# Patient Record
Sex: Female | Born: 1990 | Race: Black or African American | Hispanic: No | Marital: Single | Smoking: Never smoker
Health system: Southern US, Community
[De-identification: ages and names within clinical notes are randomized; demographics above are authoritative.]

---

## 2015-08-31 ENCOUNTER — Other Ambulatory Visit (HOSPITAL_COMMUNITY): Payer: Self-pay | Admitting: Unknown Physician Specialty

## 2015-08-31 DIAGNOSIS — Z3689 Encounter for other specified antenatal screening: Secondary | ICD-10-CM

## 2015-08-31 DIAGNOSIS — Z3A30 30 weeks gestation of pregnancy: Secondary | ICD-10-CM

## 2015-08-31 DIAGNOSIS — O283 Abnormal ultrasonic finding on antenatal screening of mother: Secondary | ICD-10-CM

## 2015-09-01 ENCOUNTER — Other Ambulatory Visit (HOSPITAL_COMMUNITY): Payer: Self-pay | Admitting: Unknown Physician Specialty

## 2015-09-01 DIAGNOSIS — Z3689 Encounter for other specified antenatal screening: Secondary | ICD-10-CM

## 2015-09-01 DIAGNOSIS — Q893 Situs inversus: Secondary | ICD-10-CM

## 2015-09-01 DIAGNOSIS — Z3A30 30 weeks gestation of pregnancy: Secondary | ICD-10-CM

## 2015-09-01 DIAGNOSIS — Q249 Congenital malformation of heart, unspecified: Secondary | ICD-10-CM

## 2015-09-02 ENCOUNTER — Ambulatory Visit (HOSPITAL_COMMUNITY)
Admission: RE | Admit: 2015-09-02 | Discharge: 2015-09-02 | Disposition: A | Discharge status: Home / Self Care | Payer: Medicaid Other | Source: Ambulatory Visit | Attending: Unknown Physician Specialty | Admitting: Unknown Physician Specialty

## 2015-09-02 ENCOUNTER — Other Ambulatory Visit (HOSPITAL_COMMUNITY): Payer: Self-pay | Admitting: Unknown Physician Specialty

## 2015-09-02 ENCOUNTER — Encounter (HOSPITAL_COMMUNITY): Payer: Self-pay

## 2015-09-02 DIAGNOSIS — Z3A29 29 weeks gestation of pregnancy: Secondary | ICD-10-CM | POA: Diagnosis not present

## 2015-09-02 DIAGNOSIS — Z315 Encounter for genetic counseling: Secondary | ICD-10-CM | POA: Insufficient documentation

## 2015-09-02 DIAGNOSIS — Q249 Congenital malformation of heart, unspecified: Secondary | ICD-10-CM

## 2015-09-02 DIAGNOSIS — O35BXX0 Maternal care for other (suspected) fetal abnormality and damage, fetal cardiac anomalies, not applicable or unspecified: Secondary | ICD-10-CM | POA: Insufficient documentation

## 2015-09-02 DIAGNOSIS — Z36 Encounter for antenatal screening of mother: Secondary | ICD-10-CM | POA: Insufficient documentation

## 2015-09-02 DIAGNOSIS — Z3689 Encounter for other specified antenatal screening: Secondary | ICD-10-CM

## 2015-09-02 DIAGNOSIS — Q893 Situs inversus: Secondary | ICD-10-CM

## 2015-09-02 DIAGNOSIS — O358XX0 Maternal care for other (suspected) fetal abnormality and damage, not applicable or unspecified: Secondary | ICD-10-CM | POA: Insufficient documentation

## 2015-09-02 DIAGNOSIS — Z3A30 30 weeks gestation of pregnancy: Secondary | ICD-10-CM

## 2015-09-02 DIAGNOSIS — O283 Abnormal ultrasonic finding on antenatal screening of mother: Secondary | ICD-10-CM | POA: Insufficient documentation

## 2015-09-02 NOTE — Progress Notes (Signed)
Genetic Counseling  High-Risk Gestation Note  Appointment Date:  09/02/2015 Referred By: Sonya Crown, MD Date of Birth:  [birth year]   Pregnancy History: G1P0 Estimated Date of Delivery: 11/13/15 Estimated Gestational Age: 67w5dAttending: BSeward Meth MD   I met with [name] her mother, Sonya Cantrell for genetic counseling because of abnormal ultrasound findings.  We began by reviewing the ultrasound in detail. 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 Complete ultrasound results reported separately. [facility]   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  Heterotaxy, or situs ambiguous, describes abnormal development and placement of organs inside the body.  The prognosis ranges from mild problems to a very poor prognosis, depending on what features are present.  Features of this condition can include the following: complex heart disease, polysplenia or asplenia, kidney disease, malrotation of the intestines, midline liver, and right-sided spleen; other organs may also be involved. Given the wide range of features, various terminology exists describing similar anatomic combinations. Right atrial isomerism (RA) typically describes asplenia, bilateral right-sidedness, and dextroisomerism. Left atrial (LA) isomerism typically describes polysplenia, bilateral left-sidedness, and levoisomerism. The prognosis for heterotaxy syndrome typically depends upon the type and severity of cardiac malformation present. Heterotaxy syndrome is associated with increased mortality, particularly in cases with obstructed pulmonary veins, outflow tract obstruction, and/or single ventricle morphology. For patients with RA isomerism, an overall 60% mortality is reported for staged single ventricle palliation,  and for patients with LA isomerism, there is an approximate 25% early mortality rate with approximately 60% 5 year survival rate.     Given the presence of complex congenial heart defect and additional ultrasound findings consistent with heterotaxy, we discussed the importance of transfer of care to deliver at a tertiary care facility. [name].    We discussed that congenital anomalies can occur as isolated, nonsyndromic birth defects, or as features of an underlying genetic syndrome.  Heterotaxy is typically sporadic. Heterotaxy has been associated with maternal diabetes mellitus. Additionally, we discussed that familial cases of heterotaxy are reported, with several causative genes identified. Autosomal dominant, autosomal recessive, and X-linked inheritance have been described. Aneuploidy is rarely associated with heterotaxy. We reviewed genes, chromosomes, and various patterns of inheritance.   We discussed that in autosomal dominant inheritance, one copy of a nonworking gene change typically causes the features, and each offspring of an individual with the causative gene change has a 1 in 2 (50%) chance to also inherit the condition. In autosomal recessive condition, an individual has two copies of a particular nonworking gene, typically inherited from carrier parents. A carrier describes an individual with one copy of a nonworking gene within a particular pair. Recurrence risk for an autosomal recessive condition for a carrier couple is 25% (1 in 4). Additionally, offspring for a carrier couple have a 1 in 4 (25%) chance to be neither a carrier nor affected and a 1 in 2 (50%) chance to be a carrier. We discussed that in X-linked inheritance, when a female carries a nonworking gene on the X chromosome, she typically has no to mild  symptoms but has a 1 in 4 (25%) chance with each pregnancy for one of the  following: 1) a female who is a carrier, 2) a female who is not a carrier, 35) a female who is unaffected, and 4) a female who inherits the nonworking gene and is affected. We discussed that recurrence risk for a future pregnancy is likely low, given that most cases are sporadic, but could be 25% in the case of an autosomal recessive (when conception occurs with the same partner) or X-linked recessive etiology.   Given rare reports of aneuploidy associated with heterotaxy, amniocentesis and noninvasive prenatal screening (NIPS) were offered to the patient.  The patient understands that chromosome analysis is available from amniocentesis in pregnancy. Single gene conditions are typically not assessed via amniocentesis unless ultrasound findings or family history are strongly suggestive of a particular single gene condition. We reviewed the conditions for which NIPS assesses including the detection rates and false positive rates. The associated 1 in 761-950 risk for complications with amniocentesis, including spontaneous pregnancy loss or preterm labor and delivery, depending upon gestational age, were reviewed. The patient declined NIPS and amniocentesis today but planned to further consider these.  We discussed that evaluation by a medical geneticist would be available postnatally.   [name] stated that she is considering adoption for this baby and was considering this prior to the news of the ultrasound findings. We discussed that there are adoption agencies that can assist with adoption in cases of prenatally diagnosed medical issues. I planned to further explore this option for the patient and contact her mother (per the patient's request) with this additional information.   [name] family history was reviewed and found to be contributory for a paternal first cousin with Down syndrome. We discussed that 95% of cases of Down  syndrome are not inherited and are the result of non-disjunction.  Three to 4% of cases of Down syndrome are the result of a translocation involving chromosome #21.  We discussed the option of chromosome analysis to determine if an individual is a carrier of a balanced translocation involving chromosome #21.  If an individual carries a balanced translocation involving chromosome #21, then the chance to have a baby with Down syndrome would be greater than the maternal age-related risk.  Given the reported family history, this relative's Down syndrome was most likely sporadic.   [name] does not know the father of the pregnancy and thus does not have information regarding his family history. We, therefore, cannot comment on how his history might contribute to the overall chance for the baby to have a birth defect.  Without further information regarding the provided family history, an accurate genetic risk cannot be calculated. Further genetic counseling is warranted if more information is obtained.  [name] stated that she was not aware of the pregnancy until recently. Given that she was not aware of the pregnancy she has had exposure to alcohol, marijuana, and cigarettes throughout the pregnancy. She reported smoking daily and reported drinking "a lot" of alcohol, which she estimated to be approximately once per week and less than 4-5 drinks at each time.  Prenatal alcohol exposure can increase the risk for growth delays, small head size, heart defects, eye and facial differences, as well as behavior problems and learning disabilities. The risk of these to occur tends to increase with the amount of alcohol consumed. However, because there is no identified safe amount of alcohol in pregnancy, it is recommended to completely avoid alcohol in pregnancy. We reviewed that available data regarding recreational use of marijuana in pregnancy have not  indicated an association with increased risk for birth  defects. Some studies have indicated a possible association with prenatal marijuana use and decreased fetal growth. Given this association, we discussed that no marijuana use in pregnancy is recommended.  She denied significant viral illnesses during the course of her pregnancy. Her medical and surgical histories were contributory for history of heart murmur at birth, which did not require surgical correction, and history of stroke at age 20 years old. The patient's mother reported that the stroke was attributed to concerns at labor and delivery.    I counseled Ms. Tenet Cantrell regarding the above risks and available options.  The approximate face-to-face time with the genetic counselor was 40 minutes.  Chipper Oman, MS Certified Genetic Counselor 09/02/2015

## 2015-09-05 NOTE — Addendum Note (Signed)
Encounter addended by: Dessie Coma Leala Bryand on: 09/05/2015  9:40 AM<BR>     Documentation filed: Letters

## 2015-09-07 ENCOUNTER — Ambulatory Visit (HOSPITAL_COMMUNITY): Admission: RE | Admit: 2015-09-07 | Payer: Medicaid Other | Source: Ambulatory Visit

## 2015-09-07 ENCOUNTER — Encounter (HOSPITAL_COMMUNITY): Payer: Self-pay

## 2016-07-07 ENCOUNTER — Encounter (HOSPITAL_COMMUNITY): Payer: Self-pay

## 2017-10-18 ENCOUNTER — Emergency Department (HOSPITAL_COMMUNITY)
Admission: EM | Admit: 2017-10-18 | Discharge: 2017-10-18 | Disposition: A | Discharge status: Home / Self Care | Payer: Self-pay | Attending: Emergency Medicine | Admitting: Emergency Medicine

## 2017-10-18 ENCOUNTER — Emergency Department (HOSPITAL_COMMUNITY): Payer: Self-pay

## 2017-10-18 ENCOUNTER — Other Ambulatory Visit: Payer: Self-pay

## 2017-10-18 ENCOUNTER — Encounter (HOSPITAL_COMMUNITY): Payer: Self-pay | Admitting: Emergency Medicine

## 2017-10-18 DIAGNOSIS — R1010 Upper abdominal pain, unspecified: Secondary | ICD-10-CM | POA: Insufficient documentation

## 2017-10-18 LAB — CBC
HCT: 41.3 % (ref 36.0–46.0)
Hemoglobin: 13.8 g/dL (ref 12.0–15.0)
MCH: 28.6 pg (ref 26.0–34.0)
MCHC: 33.4 g/dL (ref 30.0–36.0)
MCV: 85.7 fL (ref 78.0–100.0)
Platelets: 289 10*3/uL (ref 150–400)
RBC: 4.82 MIL/uL (ref 3.87–5.11)
RDW: 12.2 % (ref 11.5–15.5)
WBC: 7.8 10*3/uL (ref 4.0–10.5)

## 2017-10-18 LAB — URINALYSIS, ROUTINE W REFLEX MICROSCOPIC
Bilirubin Urine: NEGATIVE
Glucose, UA: NEGATIVE mg/dL
Hgb urine dipstick: NEGATIVE
Ketones, ur: NEGATIVE mg/dL
Leukocytes, UA: NEGATIVE
Nitrite: NEGATIVE
Protein, ur: NEGATIVE mg/dL
Specific Gravity, Urine: 1.018 (ref 1.005–1.030)
pH: 6 (ref 5.0–8.0)

## 2017-10-18 LAB — BASIC METABOLIC PANEL
Anion gap: 8 (ref 5–15)
BUN: 13 mg/dL (ref 6–20)
CO2: 24 mmol/L (ref 22–32)
Calcium: 9.6 mg/dL (ref 8.9–10.3)
Chloride: 104 mmol/L (ref 101–111)
Creatinine, Ser: 0.67 mg/dL (ref 0.44–1.00)
GFR calc Af Amer: >60 mL/min (ref >60)
GFR calc non Af Amer: >60 mL/min (ref >60)
Glucose, Bld: 96 mg/dL (ref 65–99)
Potassium: 3.4 mmol/L — ABNORMAL LOW (ref 3.5–5.1)
Sodium: 136 mmol/L (ref 135–145)

## 2017-10-18 LAB — I-STAT TROPONIN, ED: Troponin i, poc: 0 ng/mL (ref 0.00–0.08)

## 2017-10-18 MED ORDER — GI COCKTAIL ~~LOC~~
30.0000 mL | Freq: Once | ORAL | Status: AC
  Administered 2017-10-18: 30 mL via ORAL
  Filled 2017-10-18: qty 30

## 2017-10-18 MED ORDER — PANTOPRAZOLE SODIUM 20 MG PO TBEC: 20.0000 mg | DELAYED_RELEASE_TABLET | Freq: Two times a day (BID) | ORAL | 0 refills | Status: AC

## 2017-10-18 MED ORDER — FAMOTIDINE 20 MG PO TABS
20.0000 mg | ORAL_TABLET | Freq: Once | ORAL | Status: AC
  Administered 2017-10-18: 20 mg via ORAL
  Filled 2017-10-18: qty 1

## 2017-10-18 NOTE — ED Notes (Signed)
Patient transported to Ultrasound 

## 2017-10-18 NOTE — ED Notes (Signed)
Patient expressing concern of increased pain states that it is "worse than having a baby". This EMT reassessed patients vitals and assured her and the present family member that she will be seen as soon as possible and updated them on labs/xrays.

## 2017-10-18 NOTE — ED Triage Notes (Signed)
Pt c/o 8/10 cp, abd pain and right leg pain since last Friday, pt was seen on [facility] last week and treat for a UTI and she is not getting any better.

## 2017-10-18 NOTE — ED Provider Notes (Signed)
[facility]   CSN: [id number] Arrival date & time: 10/18/17  0257     History   Chief Complaint Chief Complaint  Patient presents with  . Chest Pain    HPI Sonya Cantrell is a 26 y.o. female.  HPI    26 year old female with upper abdominal pain.  Pain is in the epigastrium to right upper quadrant.  Onset last night.  Persistent since then.  Worse with certain movements.  No appreciable exacerbating relieving factors otherwise.  Radiates into back. This is preceded by a week of what she calls "heartburn." Recently diagnosed and tx'd for UTI. She says those symptoms resolved.   History reviewed. No pertinent past medical history.  Patient Active Problem List   Diagnosis Date Noted  . 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 Abnormal fetal ultrasound 09/02/2015    History reviewed. No pertinent surgical history.  OB History    Gravida Para Term Preterm AB Living   1             SAB TAB Ectopic Multiple Live Births                   Home Medications    Prior to Admission medications   Not on File    Family History History reviewed. No pertinent family history.  Social History Social History   Tobacco Use  . Smoking status: Never Smoker  . Smokeless tobacco: Never Used  Substance Use Topics  . Alcohol use: No    Frequency: Never  . Drug use: No     Allergies   Patient has no known allergies.   Review of Systems Review of Systems  All systems reviewed and negative, other than as noted in HPI.  Physical Exam Updated Vital Signs BP 100/67   Pulse 66   Temp (!) 97.5 F (36.4 C) (Oral)   Resp 18   Ht 5\' 3"  (1.6 m)   Wt 71.7 kg (158 lb)   SpO2 100%   BMI 27.99 kg/m   Physical Exam  Constitutional: She appears well-developed and well-nourished. No distress.  HENT:  Head: Normocephalic and atraumatic.  Eyes: Conjunctivae are normal. Right eye  exhibits no discharge. Left eye exhibits no discharge.  Neck: Neck supple.  Cardiovascular: Normal rate, regular rhythm and normal heart sounds. Exam reveals no gallop and no friction rub.  No murmur heard. Pulmonary/Chest: Effort normal and breath sounds normal. No respiratory distress.  Abdominal: Soft. She exhibits no distension. There is tenderness.  Musculoskeletal: She exhibits no edema or tenderness.  Neurological: She is alert.  Skin: Skin is warm and dry.  Psychiatric: She has a normal mood and affect. Her behavior is normal. Thought content normal.  Nursing note and vitals reviewed.    ED Treatments / Results  Labs (all labs ordered are listed, but only abnormal results are displayed) Labs Reviewed  BASIC METABOLIC PANEL - Abnormal; Notable for the following components:      Result Value   Potassium 3.4 (*)    All other components within normal limits  URINALYSIS, ROUTINE W REFLEX MICROSCOPIC - Abnormal; Notable for the following components:   APPearance HAZY (*)    All other components within normal limits  CBC  LIPASE, BLOOD  I-STAT TROPONIN, ED    EKG  EKG Interpretation  Date/Time:  Friday October 18 2017 03:05:09 EST Ventricular Rate:  59 PR Interval:  184 QRS Duration: 76 QT  Interval:  402 QTC Calculation: 397 R Axis:   40 Text Interpretation:  Sinus bradycardia with sinus arrhythmia Non-specific ST-t changes Confirmed by Raeford RazorKohut, Emmerich Cryer [phone]([id number]) on 10/18/2017 7:09:10 AM       Radiology Dg Chest 2 View  Result Date: 10/18/2017 CLINICAL DATA:  Chest pain for 4 days EXAM: CHEST  2 VIEW COMPARISON:  None. FINDINGS: The heart size and mediastinal contours are within normal limits. Both lungs are clear. The visualized skeletal structures are unremarkable. IMPRESSION: No active cardiopulmonary disease. Electronically Signed   By: Burman NievesWilliam  Stevens M.D.   On: 10/18/2017 04:28   [place] Abdomen Limited  Result Date: 10/18/2017 CLINICAL DATA:  26 year old female  with acute right upper quadrant abdominal pain for 1 day. EXAM: ULTRASOUND ABDOMEN LIMITED RIGHT UPPER QUADRANT COMPARISON:  None. FINDINGS: Gallbladder: The gallbladder is unremarkable. There is no evidence of cholelithiasis or acute cholecystitis. Common bile duct: Diameter: 2.5 mm. No intrahepatic or extrahepatic biliary dilatation. Liver: No focal lesion identified. Within normal limits in parenchymal echogenicity. Portal vein is patent on color Doppler imaging with normal direction of blood flow towards the liver. IMPRESSION: Normal right upper quadrant abdominal ultrasound. Electronically Signed   By: Harmon PierJeffrey  Hu M.D.   On: 10/18/2017 08:22    Procedures Procedures (including critical care time)  Medications Ordered in ED Medications - No data to display   Initial Impression / Assessment and Plan / ED Course  I have reviewed the triage vital signs and the nursing notes.  Pertinent labs & imaging results that were available during my care of the patient were reviewed by me and considered in my medical decision making (see chart for details).     Triage note says CP, but pt really c/o epigastric/RUQ pain. Suspect this may be biliary colic. PUD possible with recent "heartburn." Some tenderness on exam. No peritonitis. HD stable. Afebrile. Labs unremarkable. Will check lipase. RUQ US.    Sound normal.  Will place on PPI.  Outpatient follow-up. Final Clinical Impressions(s) / ED Diagnoses   Final diagnoses:  Upper abdominal pain    ED Discharge Orders    None       Raeford RazorKohut, Dakoda [place], MD 10/18/17 [phone]

## 2019-11-15 IMAGING — US US ABDOMEN LIMITED
1 series · 14 of 25 positions shown · non-contrast
Comparison: None.

CLINICAL DATA: 26-year-old female with acute right upper quadrant
abdominal pain for 1 day.

EXAM:
ULTRASOUND ABDOMEN LIMITED RIGHT UPPER QUADRANT

[Series 1: us abdomen limited · 0.22mm/px · 14 of 39 slices shown]
[im 1/39]
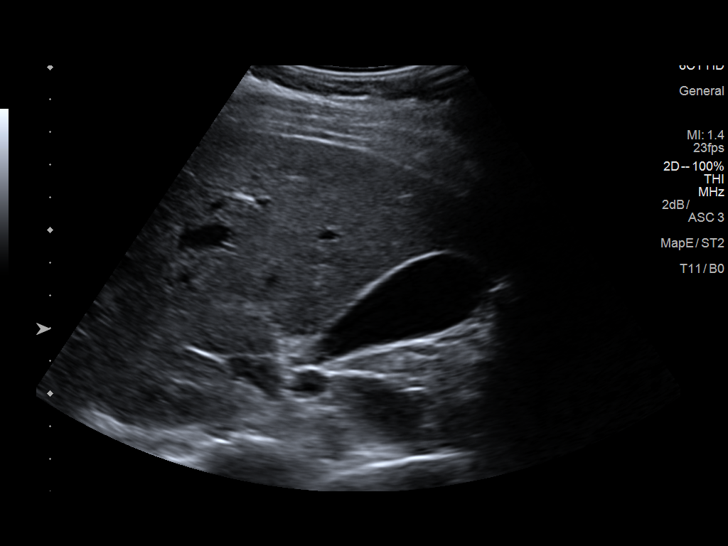
[im 4/39]
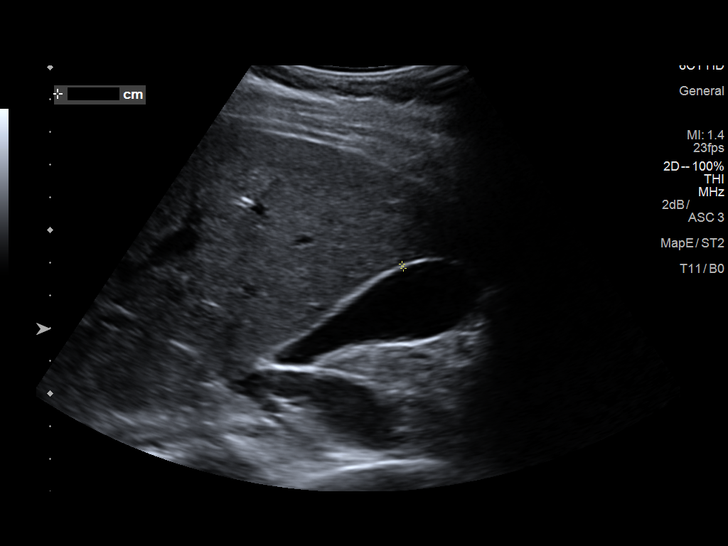
[im 7/39]
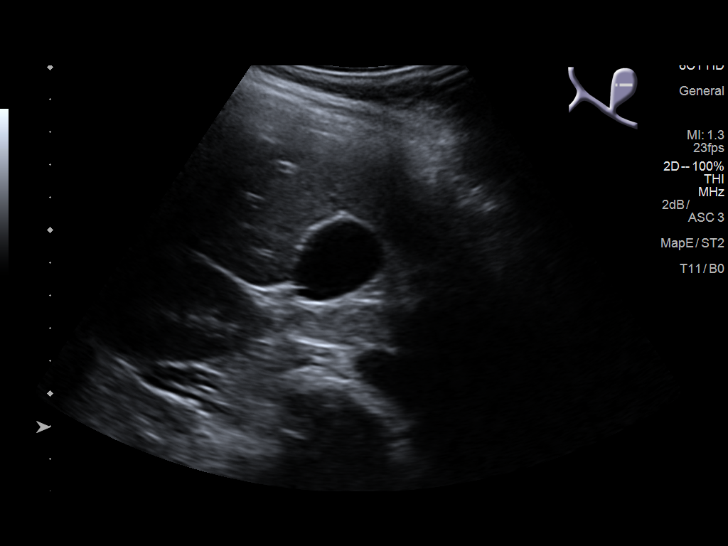
[im 10/39]
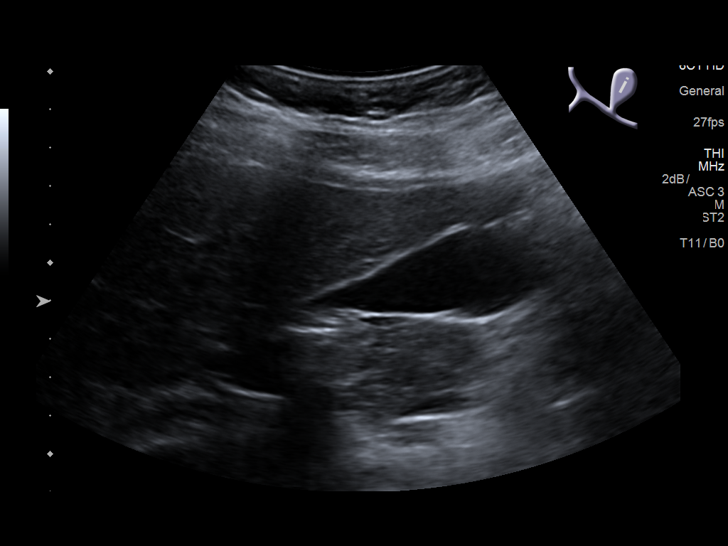
[im 13/39]
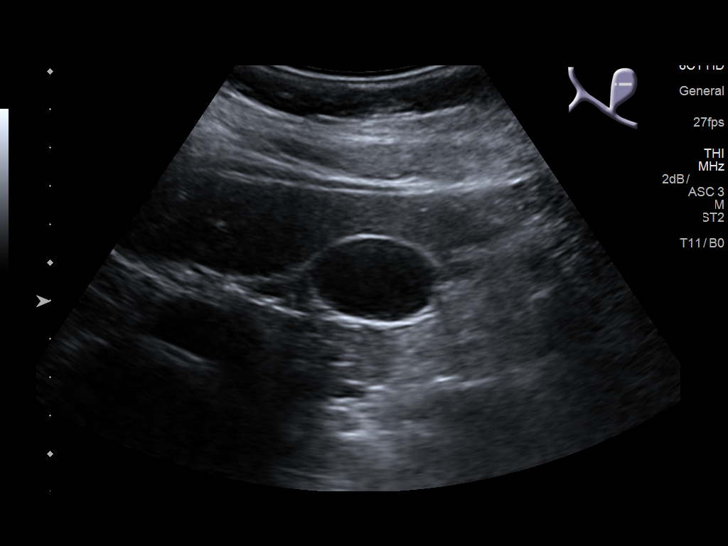
[im 15/39]
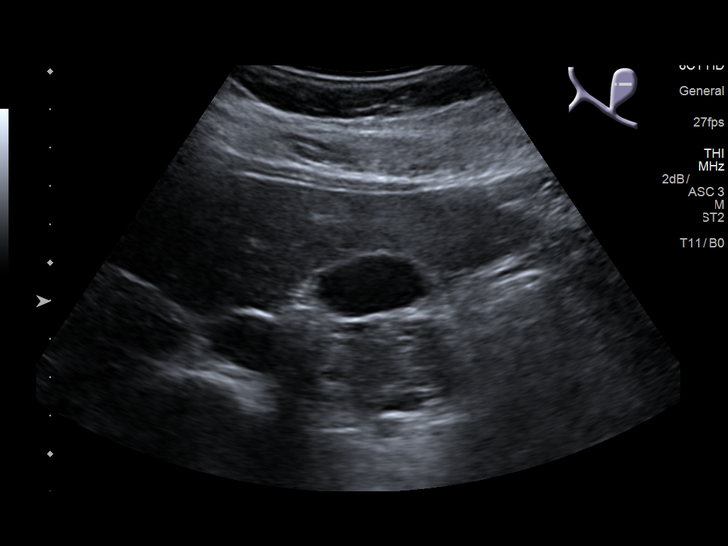
[im 18/39]
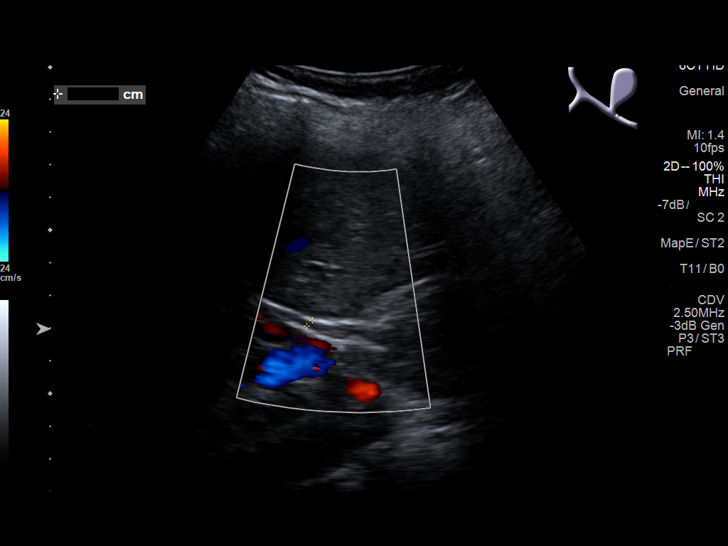
[im 21/39]
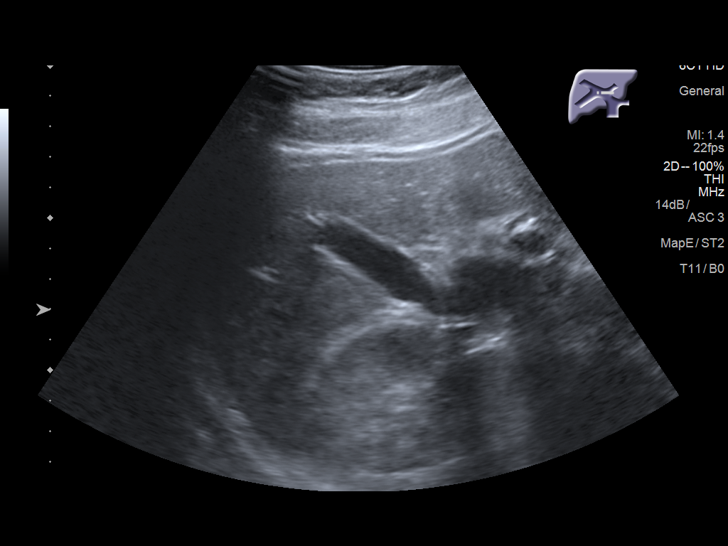
[im 24/39]
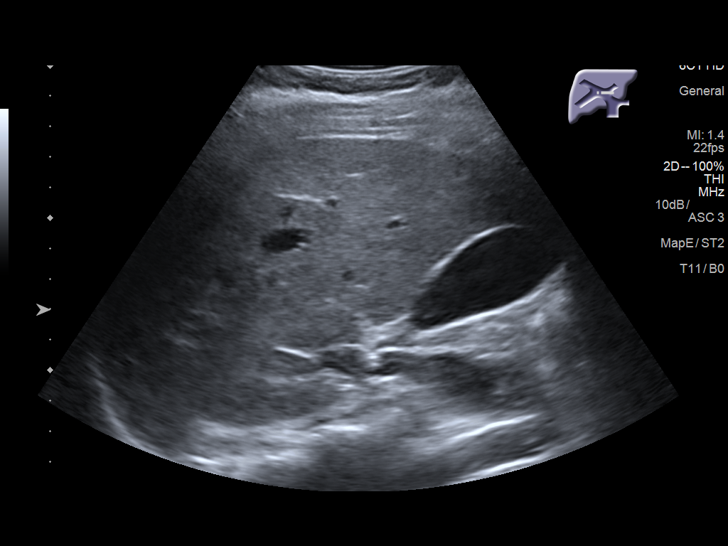
[im 26/39]
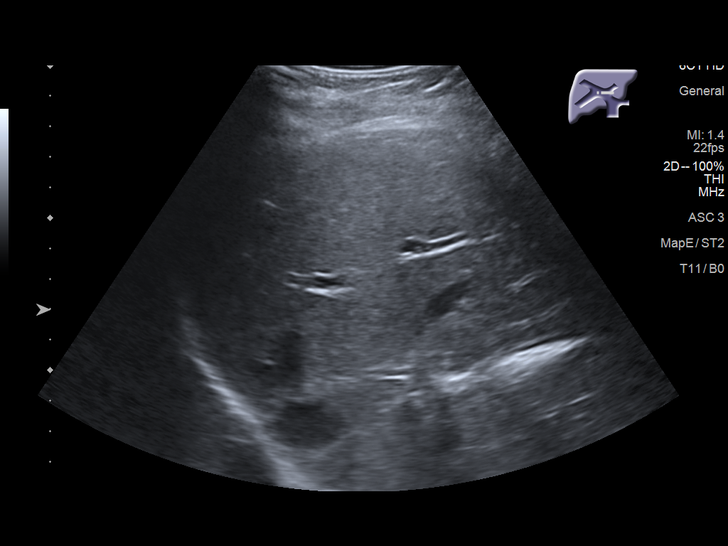
[im 29/39]
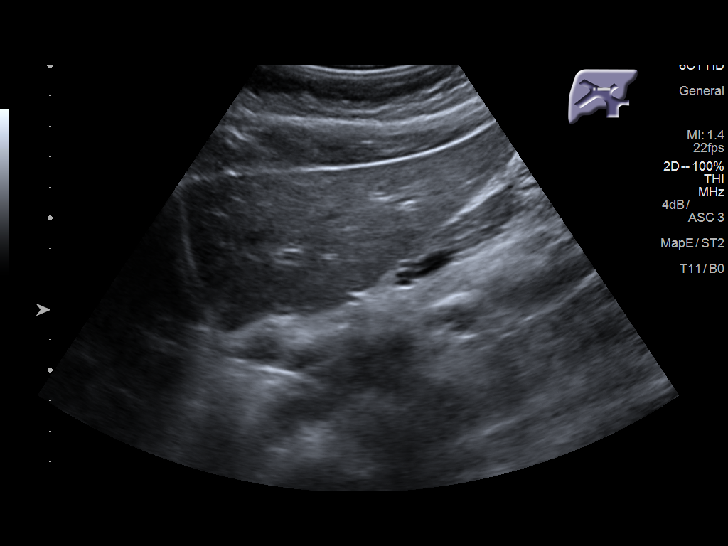
[im 32/39]
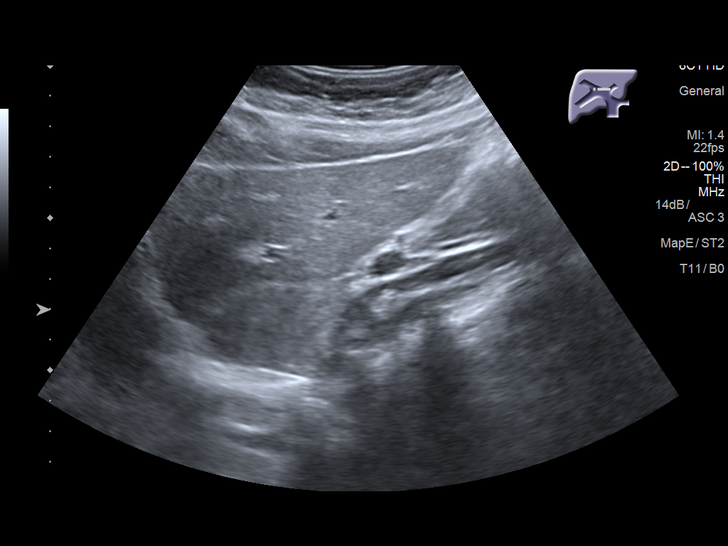
[im 35/39]
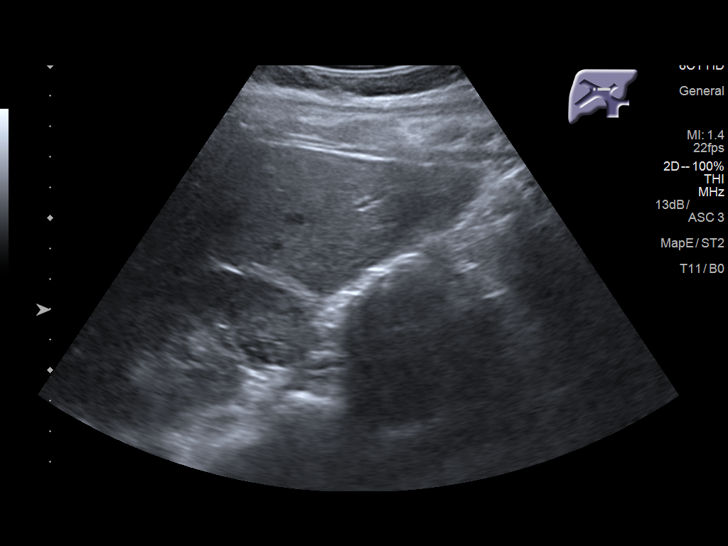
[im 39/39]
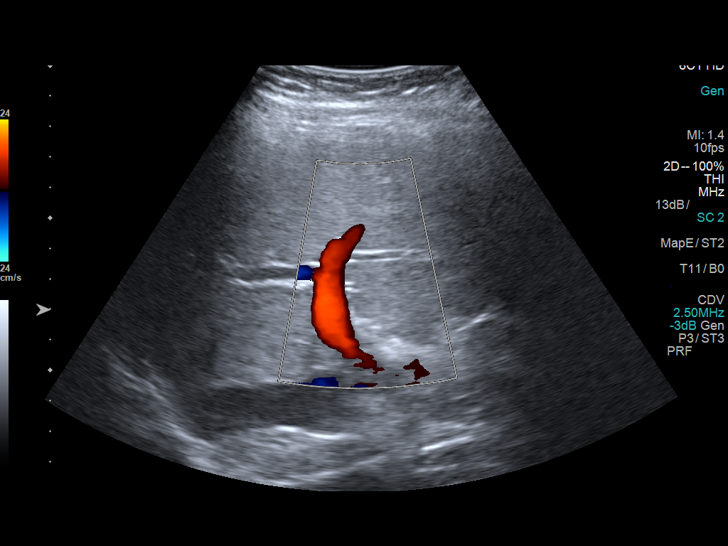

[14 of 25 positions shown; findings below may reference images not displayed]

FINDINGS: Gallbladder:

The gallbladder is unremarkable. There is no evidence of
cholelithiasis or acute cholecystitis.

Common bile duct:

Diameter: 2.5 mm. No intrahepatic or extrahepatic biliary
dilatation.

Liver:

No focal lesion identified. Within normal limits in parenchymal
echogenicity. Portal vein is patent on color Doppler imaging with
normal direction of blood flow towards the liver.
IMPRESSION: Normal right upper quadrant abdominal ultrasound.
